# Patient Record
Sex: Male | Born: 1955 | Race: White | Hispanic: No | Marital: Married | Smoking: Never smoker
Health system: Southern US, Community
[De-identification: ages and names within clinical notes are randomized; demographics above are authoritative.]

## PROBLEM LIST (undated history)

## (undated) DIAGNOSIS — D75839 Thrombocytosis, unspecified: Secondary | ICD-10-CM

## (undated) DIAGNOSIS — I82409 Acute embolism and thrombosis of unspecified deep veins of unspecified lower extremity: Secondary | ICD-10-CM

## (undated) DIAGNOSIS — I739 Peripheral vascular disease, unspecified: Secondary | ICD-10-CM

## (undated) DIAGNOSIS — D7581 Myelofibrosis: Secondary | ICD-10-CM

## (undated) DIAGNOSIS — I10 Essential (primary) hypertension: Secondary | ICD-10-CM

## (undated) DIAGNOSIS — T8859XA Other complications of anesthesia, initial encounter: Secondary | ICD-10-CM

## (undated) DIAGNOSIS — I724 Aneurysm of artery of lower extremity: Secondary | ICD-10-CM

## (undated) HISTORY — PX: HERNIA REPAIR: SHX51

## (undated) HISTORY — PX: SPLENECTOMY: SUR1306

## (undated) HISTORY — PX: POLITEAL ARTERY ANEURYSM REPAIR: SHX2241

## (undated) HISTORY — PX: CHOLECYSTECTOMY: SHX55

---

## 2020-08-16 ENCOUNTER — Emergency Department
Admission: EM | Admit: 2020-08-16 | Discharge: 2020-08-16 | Disposition: A | Discharge status: Home / Self Care | Payer: Managed Care, Other (non HMO) | Attending: Emergency Medicine | Admitting: Emergency Medicine

## 2020-08-16 ENCOUNTER — Encounter: Payer: Self-pay | Admitting: Emergency Medicine

## 2020-08-16 ENCOUNTER — Emergency Department: Payer: Managed Care, Other (non HMO)

## 2020-08-16 ENCOUNTER — Other Ambulatory Visit: Payer: Self-pay

## 2020-08-16 DIAGNOSIS — K5901 Slow transit constipation: Secondary | ICD-10-CM | POA: Diagnosis not present

## 2020-08-16 DIAGNOSIS — I10 Essential (primary) hypertension: Secondary | ICD-10-CM | POA: Insufficient documentation

## 2020-08-16 DIAGNOSIS — K5641 Fecal impaction: Secondary | ICD-10-CM

## 2020-08-16 DIAGNOSIS — K5900 Constipation, unspecified: Secondary | ICD-10-CM | POA: Diagnosis present

## 2020-08-16 HISTORY — DX: Myelofibrosis: D75.81

## 2020-08-16 HISTORY — DX: Aneurysm of artery of lower extremity: I72.4

## 2020-08-16 HISTORY — DX: Thrombocytosis, unspecified: D75.839

## 2020-08-16 HISTORY — DX: Other complications of anesthesia, initial encounter: T88.59XA

## 2020-08-16 HISTORY — DX: Acute embolism and thrombosis of unspecified deep veins of unspecified lower extremity: I82.409

## 2020-08-16 HISTORY — DX: Essential (primary) hypertension: I10

## 2020-08-16 HISTORY — DX: Peripheral vascular disease, unspecified: I73.9

## 2020-08-16 MED ORDER — MAGNESIUM CITRATE PO SOLN
1.0000 | Freq: Once | ORAL | Status: AC
  Administered 2020-08-16: 1 via ORAL
  Filled 2020-08-16: qty 296

## 2020-08-16 MED ORDER — FLEET ENEMA 7-19 GM/118ML RE ENEM
1.0000 | ENEMA | Freq: Once | RECTAL | Status: AC
  Administered 2020-08-16: 1 via RECTAL

## 2020-08-16 MED ORDER — POLYETHYLENE GLYCOL 3350 17 G PO PACK: 17.0000 g | PACK | Freq: Every day | ORAL | 0 refills | Status: AC

## 2020-08-16 MED ORDER — BISACODYL 10 MG RE SUPP: 10.0000 mg | RECTAL | 0 refills | Status: AC | PRN

## 2020-08-16 NOTE — ED Notes (Signed)
EDP Kinner to bedside. This RN about to give enema. Pt educated. Pt has already had small BMs.

## 2020-08-16 NOTE — ED Triage Notes (Addendum)
Pt c/o stool being stuck at rectum and unable to have bowel movement. Last bowel movement was oct 4.  Pt appears uncomfortable.  No vomiting. No fever. Pt used gloves and attempted to disimpact self.

## 2020-08-16 NOTE — ED Notes (Signed)
Pt states he has had a large BM and is ready to be discharged

## 2020-08-16 NOTE — ED Provider Notes (Signed)
[facility] Emergency Department Provider Note   ____________________________________________    I have reviewed the triage vital signs and the nursing notes.   HISTORY  Chief Complaint Fecal Impaction     HPI Wesley Wilcox is a 64 y.o. male who presents with complaints of constipation and likely fecal impaction.  Patient reports he has not had a bowel movement in 3 days, he feels that stool is stuck in his rectum.  He tried to self disimpact unsuccessfully.  He did take a suppository with little improvement.  Is having some abdominal cramping as well.  No nausea or vomiting.  No fevers or chills.  No other symptoms reported.  Past Medical History:  Diagnosis Date  . Anesthesia complication   . DVT (deep venous thrombosis) (HCC)   . Hypertension   . Myelofibrosis (HCC)   . PAD (peripheral artery disease) (HCC)   . Popliteal artery aneurysm (HCC)   . Thrombocythemia     There are no problems to display for this patient.   Past Surgical History:  Procedure Laterality Date  . CHOLECYSTECTOMY    . HERNIA REPAIR    . POLITEAL ARTERY ANEURYSM REPAIR    . SPLENECTOMY      Prior to Admission medications   Medication Sig Start Date End Date Taking? Authorizing Provider  bisacodyl (DULCOLAX) 10 MG suppository Place 1 suppository (10 mg total) rectally as needed for moderate constipation. 08/16/20   Jene Every, MD  polyethylene glycol (MIRALAX) 17 g packet Take 17 g by mouth daily. 08/16/20   Jene Every, MD     Allergies Ace inhibitors and Dilaudid [hydromorphone]  History reviewed. No pertinent family history.  Social History Social History   Tobacco Use  . Smoking status: Never Smoker  . Smokeless tobacco: Never Used  Substance Use Topics  . Alcohol use: Never  . Drug use: Never    Review of Systems  Constitutional: No fever/chills Eyes: No visual changes.  ENT: No sore throat. Cardiovascular: Denies chest  pain. Respiratory: Denies shortness of breath. Gastrointestinal: As above Genitourinary: Negative for dysuria. Musculoskeletal: Negative for back pain. Skin: Negative for rash. Neurological: Negative for headaches    ____________________________________________   PHYSICAL EXAM:  VITAL SIGNS: ED Triage Vitals  Enc Vitals Group     BP 08/16/20 0914 139/76     Pulse Rate 08/16/20 0914 80     Resp 08/16/20 0914 18     Temp 08/16/20 0914 98.3 F (36.8 C)     Temp Source 08/16/20 0914 Oral     SpO2 08/16/20 0914 97 %     Weight 08/16/20 0907 136.1 kg (300 lb)     Height 08/16/20 0907 1.905 m (6\' 3" )     Head Circumference --      Peak Flow --      Pain Score 08/16/20 0907 9     Pain Loc --      Pain Edu? --      Excl. in GC? --     Constitutional: Alert and oriented.   Nose: No congestion/rhinnorhea. Mouth/Throat: Mucous membranes are moist.    Cardiovascular: Normal rate, regular rhythm. Grossly normal heart sounds.  Good peripheral circulation. Respiratory: Normal respiratory effort.  No retractions. Lungs CTAB. Gastrointestinal: Soft and nontender. No distention.  No CVA tenderness.  On rectal exam, hard brown stool  Musculoskeletal: Warm and well perfused Neurologic:  Normal speech and language. No gross focal neurologic deficits are appreciated.  Skin:  Skin is  warm, dry and intact. No rash noted. Psychiatric: Mood and affect are normal. Speech and behavior are normal.  ____________________________________________   LABS (all labs ordered are listed, but only abnormal results are displayed)  Labs Reviewed - No data to display ____________________________________________  EKG  None ____________________________________________  RADIOLOGY  None ____________________________________________   PROCEDURES  Procedure(s) performed:yes  ------------------------------------------------------------------------------------------------------------------- Fecal  Disimpaction Procedure Note:  Performed by me:  Patient placed in the lateral recumbent position with knees drawn towards chest. Nurse present for patient support. Large amount of hard brown stool removed. No complications during procedure.   ------------------------------------------------------------------------------------------------------------------    Procedures   Critical Care performed: No ____________________________________________   INITIAL IMPRESSION / ASSESSMENT AND PLAN / ED COURSE  Pertinent labs & imaging results that were available during my care of the patient were reviewed by me and considered in my medical decision making (see chart for details).  Patient presents with complaints of constipation, fecal impaction.  Fecal disimpaction performed by me, moderate amount of firm brown stool removed.  Magnesium citrate given, will see if the patient had a bowel movement.  Patient with small bowel movement, will give fleets enema, appropriate for discharge with MiraLAX, Dulcolax suppositories, return precautions discussed    ____________________________________________   FINAL CLINICAL IMPRESSION(S) / ED DIAGNOSES  Final diagnoses:  Slow transit constipation  Fecal impaction (HCC)        Note:  This document was prepared using Dragon voice recognition software and may include unintentional dictation errors.   Jene Every, MD 08/16/20 [phone]

## 2020-08-16 NOTE — ED Notes (Signed)
HOB lowered of pt- pt states he is starting to have abdominal cramping and appears very uncomfortable

## 2021-12-20 IMAGING — CR DG ABDOMEN 1V
1 series · 3 of 3 positions shown · non-contrast
Comparison: No prior.

CLINICAL DATA: Constipation.

EXAM:
ABDOMEN - 1 VIEW

[Series 1: dg abd 1 view · 0.14mm/px · 3 of 3 slices shown]
[im 1/3]
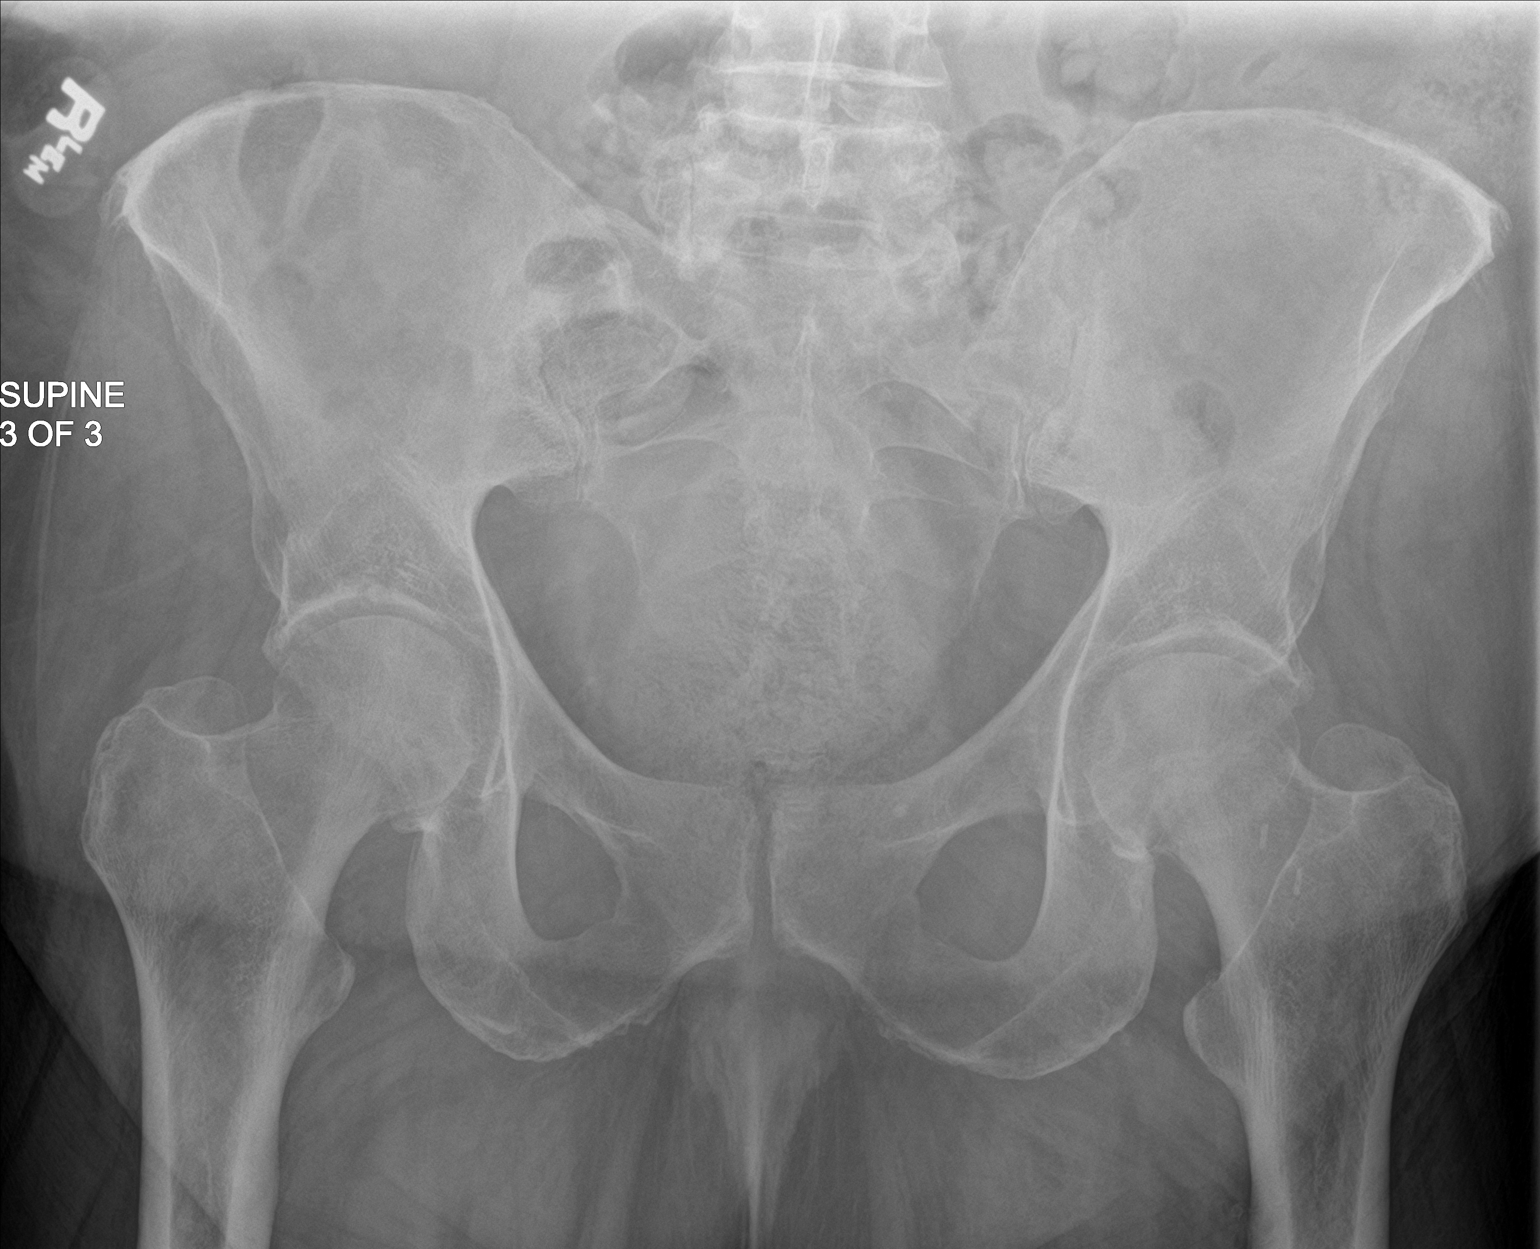
[im 2/3]
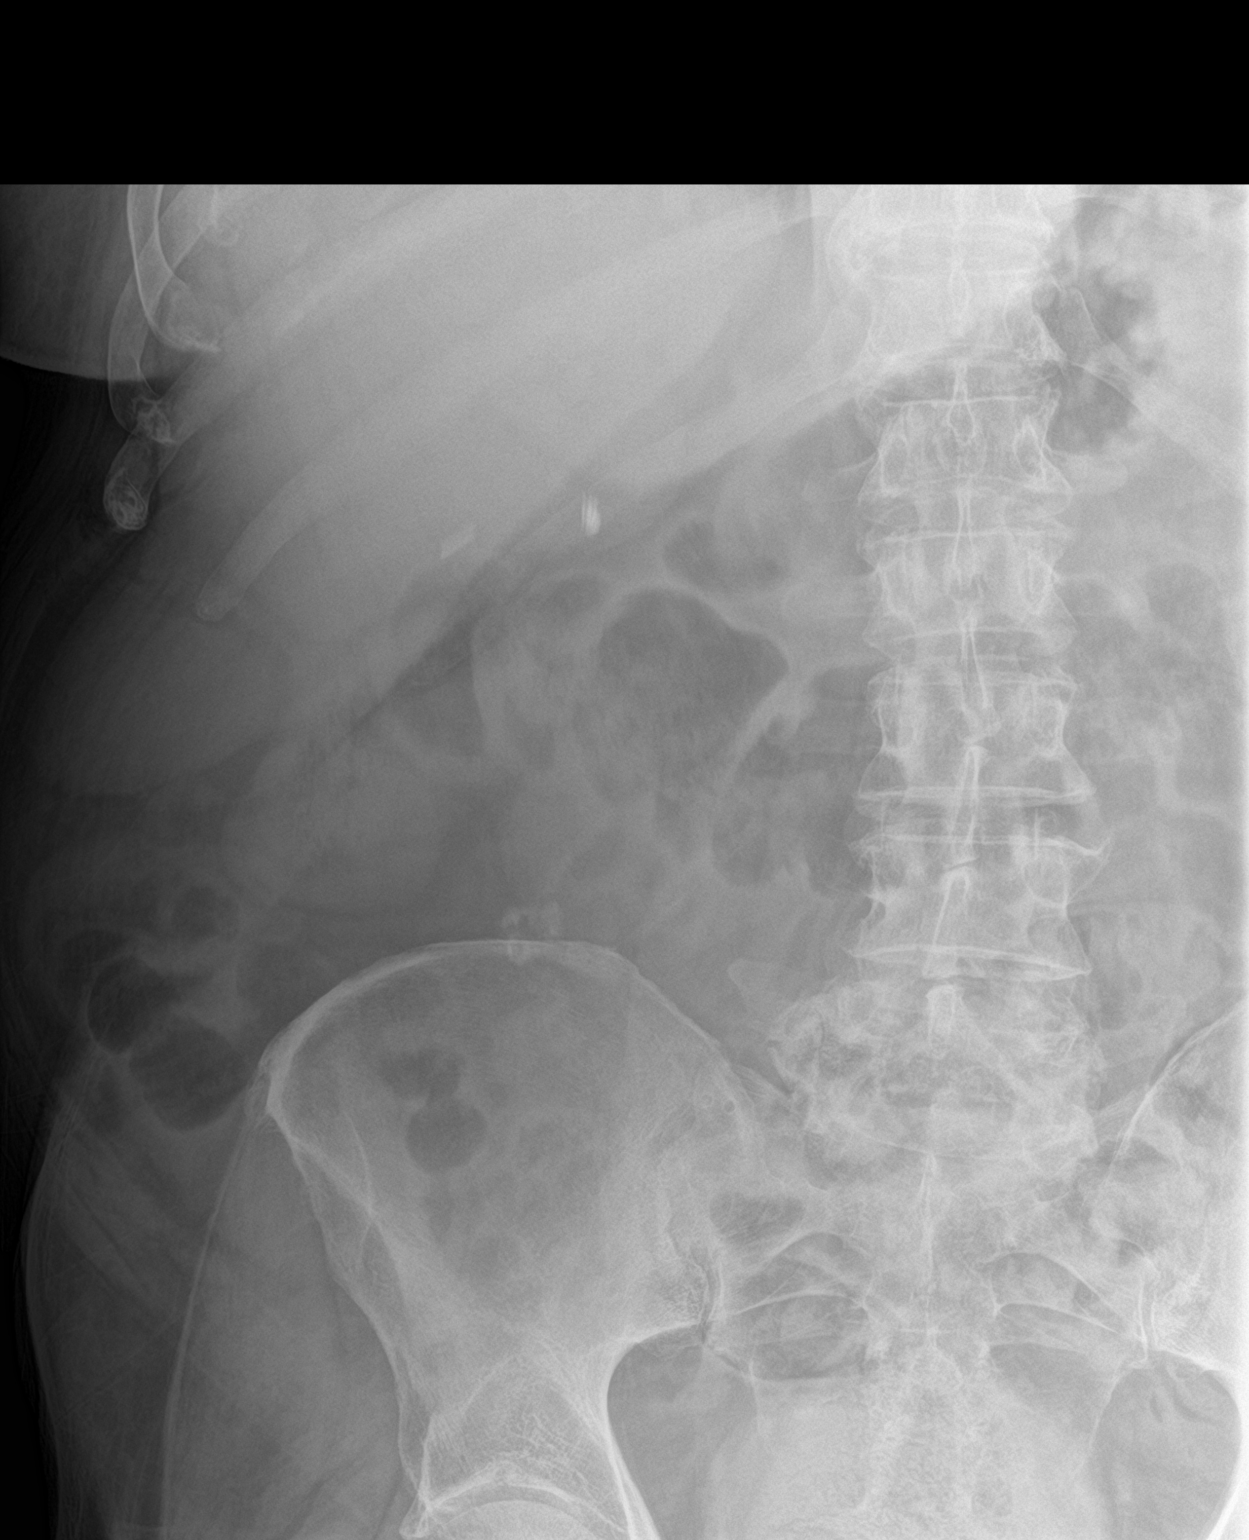
[im 3/3]
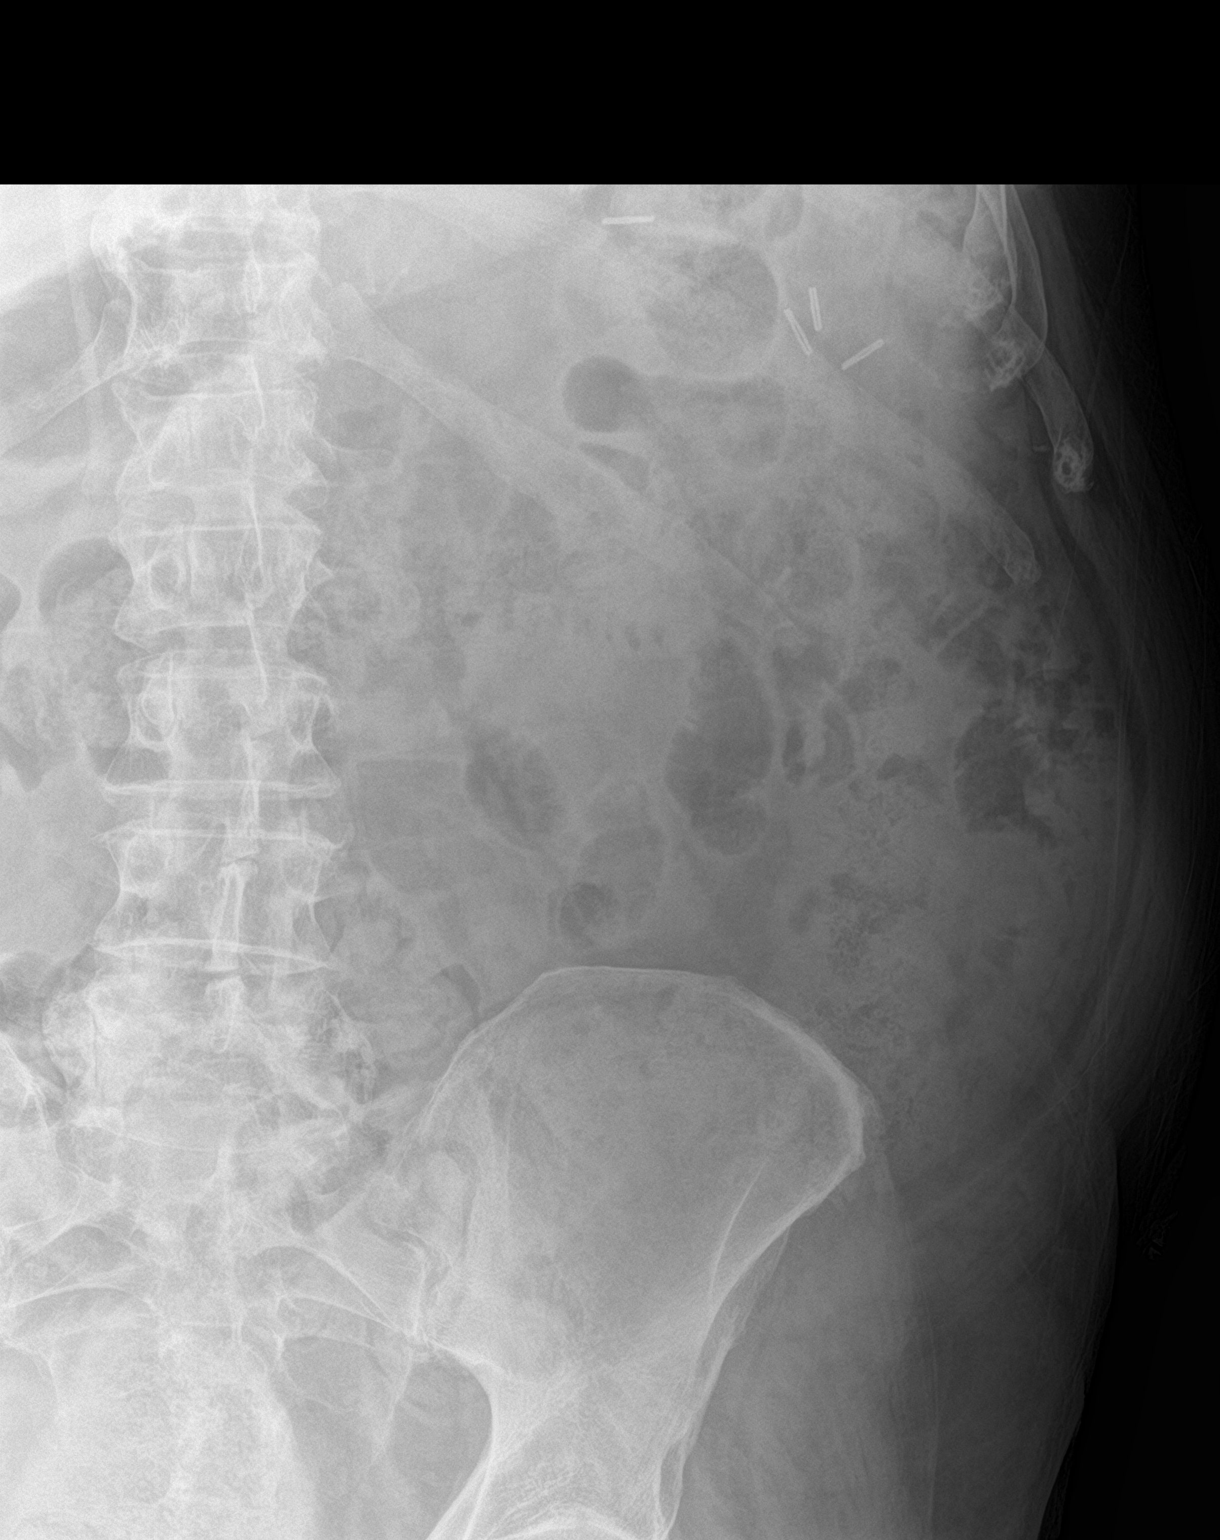

[3 of 3 positions shown; findings below may reference images not displayed]

FINDINGS: Surgical clips noted throughout the upper abdomen. No bowel
distention. No free air. Stool noted throughout the colon and
rectum. Prominent stool volume. Calcific densities noted over the
right abdomen of questionable etiology. Degenerative change lumbar
spine and both hips. No acute bony or joint abnormality. No evidence
of fracture. Pelvic calcifications consistent phleboliths.
IMPRESSION: Prominent stool volume noted throughout the colon and rectum. No
bowel distention.

## 2022-05-10 DEATH — deceased
